# Patient Record
Sex: Female | Born: 1955 | Race: White | Hispanic: No | Marital: Married
Health system: Southern US, Community
[De-identification: ages and names within clinical notes are randomized; demographics above are authoritative.]

---

## 2019-03-28 ENCOUNTER — Other Ambulatory Visit: Payer: Self-pay | Admitting: Family Medicine

## 2019-03-28 ENCOUNTER — Other Ambulatory Visit: Payer: Self-pay

## 2019-03-28 DIAGNOSIS — Z1231 Encounter for screening mammogram for malignant neoplasm of breast: Secondary | ICD-10-CM

## 2019-04-07 ENCOUNTER — Other Ambulatory Visit: Payer: Self-pay

## 2019-04-07 ENCOUNTER — Ambulatory Visit
Admission: RE | Admit: 2019-04-07 | Discharge: 2019-04-07 | Disposition: A | Discharge status: Home / Self Care | Payer: Medicare HMO | Source: Ambulatory Visit | Attending: Family Medicine | Admitting: Family Medicine

## 2019-04-07 DIAGNOSIS — Z1231 Encounter for screening mammogram for malignant neoplasm of breast: Secondary | ICD-10-CM | POA: Diagnosis not present

## 2019-07-09 DIAGNOSIS — R69 Illness, unspecified: Secondary | ICD-10-CM | POA: Diagnosis not present

## 2019-08-25 DIAGNOSIS — M4727 Other spondylosis with radiculopathy, lumbosacral region: Secondary | ICD-10-CM | POA: Diagnosis not present

## 2019-08-25 DIAGNOSIS — Z23 Encounter for immunization: Secondary | ICD-10-CM | POA: Diagnosis not present

## 2019-08-25 DIAGNOSIS — R69 Illness, unspecified: Secondary | ICD-10-CM | POA: Diagnosis not present

## 2019-08-25 DIAGNOSIS — Z0000 Encounter for general adult medical examination without abnormal findings: Secondary | ICD-10-CM | POA: Diagnosis not present

## 2019-08-25 DIAGNOSIS — Z6821 Body mass index (BMI) 21.0-21.9, adult: Secondary | ICD-10-CM | POA: Diagnosis not present

## 2019-09-12 DIAGNOSIS — L821 Other seborrheic keratosis: Secondary | ICD-10-CM | POA: Diagnosis not present

## 2019-09-12 DIAGNOSIS — L570 Actinic keratosis: Secondary | ICD-10-CM | POA: Diagnosis not present

## 2019-09-12 DIAGNOSIS — Z23 Encounter for immunization: Secondary | ICD-10-CM | POA: Diagnosis not present

## 2019-09-12 DIAGNOSIS — D2271 Melanocytic nevi of right lower limb, including hip: Secondary | ICD-10-CM | POA: Diagnosis not present

## 2019-12-16 DIAGNOSIS — L570 Actinic keratosis: Secondary | ICD-10-CM | POA: Diagnosis not present

## 2019-12-16 DIAGNOSIS — L821 Other seborrheic keratosis: Secondary | ICD-10-CM | POA: Diagnosis not present

## 2019-12-16 DIAGNOSIS — Z872 Personal history of diseases of the skin and subcutaneous tissue: Secondary | ICD-10-CM | POA: Diagnosis not present

## 2019-12-28 ENCOUNTER — Ambulatory Visit: Payer: Medicare HMO

## 2019-12-30 DIAGNOSIS — H40053 Ocular hypertension, bilateral: Secondary | ICD-10-CM | POA: Diagnosis not present

## 2020-01-02 DIAGNOSIS — D485 Neoplasm of uncertain behavior of skin: Secondary | ICD-10-CM | POA: Diagnosis not present

## 2020-01-02 DIAGNOSIS — I781 Nevus, non-neoplastic: Secondary | ICD-10-CM | POA: Diagnosis not present

## 2020-01-02 DIAGNOSIS — H02834 Dermatochalasis of left upper eyelid: Secondary | ICD-10-CM | POA: Diagnosis not present

## 2020-01-02 DIAGNOSIS — H02831 Dermatochalasis of right upper eyelid: Secondary | ICD-10-CM | POA: Diagnosis not present

## 2020-01-03 ENCOUNTER — Ambulatory Visit: Payer: Medicare HMO | Attending: Internal Medicine

## 2020-01-03 DIAGNOSIS — Z23 Encounter for immunization: Secondary | ICD-10-CM | POA: Insufficient documentation

## 2020-01-03 NOTE — Progress Notes (Signed)
   Covid-19 Vaccination Clinic  Name:  Evelyn Cook    MRN: [id number] DOB: 1956/11/12  01/03/2020  Evelyn Cook was observed post Covid-19 immunization for 15 minutes without incidence. She was provided with Vaccine Information Sheet and instruction to access the V-Safe system.   Evelyn Cook was instructed to call 911 with any severe reactions post vaccine: Marland Kitchen Difficulty breathing  . Swelling of your face and throat  . A fast heartbeat  . A bad rash all over your body  . Dizziness and weakness    Immunizations Administered    Name Date Dose VIS Date Route   Pfizer COVID-19 Vaccine 01/03/2020 11:49 AM 0.3 mL 11/07/2019 Intramuscular   Manufacturer: [place]   Lot: [id number]   [place]: [id number]

## 2020-01-08 ENCOUNTER — Ambulatory Visit: Payer: Medicare HMO

## 2020-01-14 DIAGNOSIS — R69 Illness, unspecified: Secondary | ICD-10-CM | POA: Diagnosis not present

## 2020-01-27 ENCOUNTER — Ambulatory Visit: Payer: Medicare HMO | Attending: Internal Medicine

## 2020-01-27 DIAGNOSIS — Z23 Encounter for immunization: Secondary | ICD-10-CM | POA: Insufficient documentation

## 2020-01-27 NOTE — Progress Notes (Signed)
   Covid-19 Vaccination Clinic  Name:  Magda Bolenbaugh    MRN: [id number] DOB: 1956-08-01  01/27/2020  Ms. Wissinger was observed post Covid-19 immunization for 15 minutes without incident. She was provided with Vaccine Information Sheet and instruction to access the V-Safe system.   Ms. Claycamp was instructed to call 911 with any severe reactions post vaccine: Marland Kitchen Difficulty breathing  . Swelling of face and throat  . A fast heartbeat  . A bad rash all over body  . Dizziness and weakness   Immunizations Administered    Name Date Dose VIS Date Route   Pfizer COVID-19 Vaccine 01/27/2020 10:09 AM 0.3 mL 11/07/2019 Intramuscular   Manufacturer: [place]   Lot: [id number]   [place]: [id number]

## 2020-01-29 DIAGNOSIS — L570 Actinic keratosis: Secondary | ICD-10-CM | POA: Diagnosis not present

## 2020-01-29 DIAGNOSIS — D485 Neoplasm of uncertain behavior of skin: Secondary | ICD-10-CM | POA: Diagnosis not present

## 2020-01-29 DIAGNOSIS — L905 Scar conditions and fibrosis of skin: Secondary | ICD-10-CM | POA: Diagnosis not present

## 2020-02-12 DIAGNOSIS — H02834 Dermatochalasis of left upper eyelid: Secondary | ICD-10-CM | POA: Diagnosis not present

## 2020-02-12 DIAGNOSIS — I781 Nevus, non-neoplastic: Secondary | ICD-10-CM | POA: Diagnosis not present

## 2020-02-12 DIAGNOSIS — H02831 Dermatochalasis of right upper eyelid: Secondary | ICD-10-CM | POA: Diagnosis not present

## 2020-02-12 DIAGNOSIS — L570 Actinic keratosis: Secondary | ICD-10-CM | POA: Diagnosis not present

## 2020-03-29 DIAGNOSIS — H40013 Open angle with borderline findings, low risk, bilateral: Secondary | ICD-10-CM | POA: Diagnosis not present

## 2020-03-31 DIAGNOSIS — R140 Abdominal distension (gaseous): Secondary | ICD-10-CM | POA: Diagnosis not present

## 2020-03-31 DIAGNOSIS — Z86010 Personal history of colonic polyps: Secondary | ICD-10-CM | POA: Diagnosis not present

## 2020-03-31 DIAGNOSIS — R195 Other fecal abnormalities: Secondary | ICD-10-CM | POA: Diagnosis not present

## 2020-05-19 DIAGNOSIS — Z1159 Encounter for screening for other viral diseases: Secondary | ICD-10-CM | POA: Diagnosis not present

## 2020-05-24 DIAGNOSIS — K635 Polyp of colon: Secondary | ICD-10-CM | POA: Diagnosis not present

## 2020-05-24 DIAGNOSIS — D122 Benign neoplasm of ascending colon: Secondary | ICD-10-CM | POA: Diagnosis not present

## 2020-05-24 DIAGNOSIS — K5730 Diverticulosis of large intestine without perforation or abscess without bleeding: Secondary | ICD-10-CM | POA: Diagnosis not present

## 2020-05-24 DIAGNOSIS — K644 Residual hemorrhoidal skin tags: Secondary | ICD-10-CM | POA: Diagnosis not present

## 2020-05-24 DIAGNOSIS — K648 Other hemorrhoids: Secondary | ICD-10-CM | POA: Diagnosis not present

## 2020-05-24 DIAGNOSIS — Z86010 Personal history of colonic polyps: Secondary | ICD-10-CM | POA: Diagnosis not present

## 2020-05-26 DIAGNOSIS — D122 Benign neoplasm of ascending colon: Secondary | ICD-10-CM | POA: Diagnosis not present

## 2020-05-26 DIAGNOSIS — K635 Polyp of colon: Secondary | ICD-10-CM | POA: Diagnosis not present

## 2020-06-03 ENCOUNTER — Other Ambulatory Visit: Payer: Self-pay | Admitting: Family Medicine

## 2020-06-03 DIAGNOSIS — Z1231 Encounter for screening mammogram for malignant neoplasm of breast: Secondary | ICD-10-CM

## 2020-06-25 DIAGNOSIS — I781 Nevus, non-neoplastic: Secondary | ICD-10-CM | POA: Diagnosis not present

## 2020-06-25 DIAGNOSIS — H02831 Dermatochalasis of right upper eyelid: Secondary | ICD-10-CM | POA: Diagnosis not present

## 2020-06-25 DIAGNOSIS — H02834 Dermatochalasis of left upper eyelid: Secondary | ICD-10-CM | POA: Diagnosis not present

## 2020-06-25 DIAGNOSIS — L814 Other melanin hyperpigmentation: Secondary | ICD-10-CM | POA: Diagnosis not present

## 2020-06-25 DIAGNOSIS — Z872 Personal history of diseases of the skin and subcutaneous tissue: Secondary | ICD-10-CM | POA: Diagnosis not present

## 2020-06-30 ENCOUNTER — Ambulatory Visit: Payer: Medicare HMO

## 2020-06-30 DIAGNOSIS — J029 Acute pharyngitis, unspecified: Secondary | ICD-10-CM | POA: Diagnosis not present

## 2020-06-30 DIAGNOSIS — R05 Cough: Secondary | ICD-10-CM | POA: Diagnosis not present

## 2020-07-14 DIAGNOSIS — R69 Illness, unspecified: Secondary | ICD-10-CM | POA: Diagnosis not present

## 2020-07-26 DIAGNOSIS — R69 Illness, unspecified: Secondary | ICD-10-CM | POA: Diagnosis not present

## 2020-08-27 DIAGNOSIS — Z131 Encounter for screening for diabetes mellitus: Secondary | ICD-10-CM | POA: Diagnosis not present

## 2020-08-27 DIAGNOSIS — R69 Illness, unspecified: Secondary | ICD-10-CM | POA: Diagnosis not present

## 2020-08-27 DIAGNOSIS — Z6821 Body mass index (BMI) 21.0-21.9, adult: Secondary | ICD-10-CM | POA: Diagnosis not present

## 2020-08-27 DIAGNOSIS — Z1159 Encounter for screening for other viral diseases: Secondary | ICD-10-CM | POA: Diagnosis not present

## 2020-08-27 DIAGNOSIS — Z23 Encounter for immunization: Secondary | ICD-10-CM | POA: Diagnosis not present

## 2020-08-27 DIAGNOSIS — Z780 Asymptomatic menopausal state: Secondary | ICD-10-CM | POA: Diagnosis not present

## 2020-08-27 DIAGNOSIS — Z86010 Personal history of colonic polyps: Secondary | ICD-10-CM | POA: Diagnosis not present

## 2020-08-27 DIAGNOSIS — Z136 Encounter for screening for cardiovascular disorders: Secondary | ICD-10-CM | POA: Diagnosis not present

## 2020-08-27 DIAGNOSIS — Z0000 Encounter for general adult medical examination without abnormal findings: Secondary | ICD-10-CM | POA: Diagnosis not present

## 2020-09-03 ENCOUNTER — Other Ambulatory Visit: Payer: Self-pay | Admitting: Family Medicine

## 2020-09-03 DIAGNOSIS — E2839 Other primary ovarian failure: Secondary | ICD-10-CM

## 2020-09-03 DIAGNOSIS — Z780 Asymptomatic menopausal state: Secondary | ICD-10-CM

## 2020-09-28 DIAGNOSIS — Z86018 Personal history of other benign neoplasm: Secondary | ICD-10-CM | POA: Diagnosis not present

## 2020-09-28 DIAGNOSIS — D225 Melanocytic nevi of trunk: Secondary | ICD-10-CM | POA: Diagnosis not present

## 2020-09-28 DIAGNOSIS — L578 Other skin changes due to chronic exposure to nonionizing radiation: Secondary | ICD-10-CM | POA: Diagnosis not present

## 2020-09-28 DIAGNOSIS — L570 Actinic keratosis: Secondary | ICD-10-CM | POA: Diagnosis not present

## 2020-09-28 DIAGNOSIS — D2271 Melanocytic nevi of right lower limb, including hip: Secondary | ICD-10-CM | POA: Diagnosis not present

## 2020-09-28 DIAGNOSIS — L821 Other seborrheic keratosis: Secondary | ICD-10-CM | POA: Diagnosis not present

## 2020-10-05 DIAGNOSIS — H40013 Open angle with borderline findings, low risk, bilateral: Secondary | ICD-10-CM | POA: Diagnosis not present

## 2020-12-20 ENCOUNTER — Other Ambulatory Visit: Payer: Self-pay

## 2020-12-20 ENCOUNTER — Other Ambulatory Visit: Payer: Medicare HMO

## 2020-12-20 ENCOUNTER — Ambulatory Visit
Admission: RE | Admit: 2020-12-20 | Discharge: 2020-12-20 | Disposition: A | Discharge status: Home / Self Care | Payer: Medicare HMO | Source: Ambulatory Visit | Attending: Family Medicine | Admitting: Family Medicine

## 2020-12-20 DIAGNOSIS — Z1231 Encounter for screening mammogram for malignant neoplasm of breast: Secondary | ICD-10-CM

## 2021-03-04 ENCOUNTER — Other Ambulatory Visit: Payer: Medicare HMO

## 2021-03-09 ENCOUNTER — Other Ambulatory Visit: Payer: Medicare HMO

## 2021-03-29 ENCOUNTER — Ambulatory Visit
Admission: RE | Admit: 2021-03-29 | Discharge: 2021-03-29 | Disposition: A | Discharge status: Home / Self Care | Payer: Medicare HMO | Source: Ambulatory Visit | Attending: Family Medicine | Admitting: Family Medicine

## 2021-03-29 ENCOUNTER — Other Ambulatory Visit: Payer: Self-pay

## 2021-03-29 DIAGNOSIS — M85852 Other specified disorders of bone density and structure, left thigh: Secondary | ICD-10-CM | POA: Diagnosis not present

## 2021-03-29 DIAGNOSIS — Z780 Asymptomatic menopausal state: Secondary | ICD-10-CM

## 2021-03-29 DIAGNOSIS — E2839 Other primary ovarian failure: Secondary | ICD-10-CM

## 2021-04-18 DIAGNOSIS — M5442 Lumbago with sciatica, left side: Secondary | ICD-10-CM | POA: Diagnosis not present

## 2021-04-29 DIAGNOSIS — M6281 Muscle weakness (generalized): Secondary | ICD-10-CM | POA: Diagnosis not present

## 2021-04-29 DIAGNOSIS — M5416 Radiculopathy, lumbar region: Secondary | ICD-10-CM | POA: Diagnosis not present

## 2021-05-16 DIAGNOSIS — M6281 Muscle weakness (generalized): Secondary | ICD-10-CM | POA: Diagnosis not present

## 2021-05-16 DIAGNOSIS — M5416 Radiculopathy, lumbar region: Secondary | ICD-10-CM | POA: Diagnosis not present

## 2021-05-17 DIAGNOSIS — M6281 Muscle weakness (generalized): Secondary | ICD-10-CM | POA: Diagnosis not present

## 2021-05-17 DIAGNOSIS — M5416 Radiculopathy, lumbar region: Secondary | ICD-10-CM | POA: Diagnosis not present

## 2021-05-18 DIAGNOSIS — M5416 Radiculopathy, lumbar region: Secondary | ICD-10-CM | POA: Diagnosis not present

## 2021-05-18 DIAGNOSIS — M6281 Muscle weakness (generalized): Secondary | ICD-10-CM | POA: Diagnosis not present

## 2021-05-23 DIAGNOSIS — M5416 Radiculopathy, lumbar region: Secondary | ICD-10-CM | POA: Diagnosis not present

## 2021-05-23 DIAGNOSIS — M6281 Muscle weakness (generalized): Secondary | ICD-10-CM | POA: Diagnosis not present

## 2021-05-25 DIAGNOSIS — M6281 Muscle weakness (generalized): Secondary | ICD-10-CM | POA: Diagnosis not present

## 2021-05-25 DIAGNOSIS — M5416 Radiculopathy, lumbar region: Secondary | ICD-10-CM | POA: Diagnosis not present

## 2021-06-01 DIAGNOSIS — M6281 Muscle weakness (generalized): Secondary | ICD-10-CM | POA: Diagnosis not present

## 2021-06-01 DIAGNOSIS — M5416 Radiculopathy, lumbar region: Secondary | ICD-10-CM | POA: Diagnosis not present

## 2021-06-03 DIAGNOSIS — M6281 Muscle weakness (generalized): Secondary | ICD-10-CM | POA: Diagnosis not present

## 2021-06-03 DIAGNOSIS — M5416 Radiculopathy, lumbar region: Secondary | ICD-10-CM | POA: Diagnosis not present

## 2021-09-02 DIAGNOSIS — E7800 Pure hypercholesterolemia, unspecified: Secondary | ICD-10-CM | POA: Diagnosis not present

## 2021-09-02 DIAGNOSIS — Z23 Encounter for immunization: Secondary | ICD-10-CM | POA: Diagnosis not present

## 2021-09-02 DIAGNOSIS — F5101 Primary insomnia: Secondary | ICD-10-CM | POA: Diagnosis not present

## 2021-09-02 DIAGNOSIS — M859 Disorder of bone density and structure, unspecified: Secondary | ICD-10-CM | POA: Diagnosis not present

## 2021-09-02 DIAGNOSIS — M4727 Other spondylosis with radiculopathy, lumbosacral region: Secondary | ICD-10-CM | POA: Diagnosis not present

## 2021-09-02 DIAGNOSIS — Z79899 Other long term (current) drug therapy: Secondary | ICD-10-CM | POA: Diagnosis not present

## 2021-09-02 DIAGNOSIS — M8580 Other specified disorders of bone density and structure, unspecified site: Secondary | ICD-10-CM | POA: Diagnosis not present

## 2021-09-02 DIAGNOSIS — R69 Illness, unspecified: Secondary | ICD-10-CM | POA: Diagnosis not present

## 2021-09-02 DIAGNOSIS — Z0000 Encounter for general adult medical examination without abnormal findings: Secondary | ICD-10-CM | POA: Diagnosis not present

## 2021-09-02 DIAGNOSIS — Z86010 Personal history of colonic polyps: Secondary | ICD-10-CM | POA: Diagnosis not present

## 2021-09-29 DIAGNOSIS — L578 Other skin changes due to chronic exposure to nonionizing radiation: Secondary | ICD-10-CM | POA: Diagnosis not present

## 2021-09-29 DIAGNOSIS — Z86018 Personal history of other benign neoplasm: Secondary | ICD-10-CM | POA: Diagnosis not present

## 2021-09-29 DIAGNOSIS — D2271 Melanocytic nevi of right lower limb, including hip: Secondary | ICD-10-CM | POA: Diagnosis not present

## 2021-09-29 DIAGNOSIS — L570 Actinic keratosis: Secondary | ICD-10-CM | POA: Diagnosis not present

## 2021-09-29 DIAGNOSIS — D225 Melanocytic nevi of trunk: Secondary | ICD-10-CM | POA: Diagnosis not present

## 2021-09-29 DIAGNOSIS — L821 Other seborrheic keratosis: Secondary | ICD-10-CM | POA: Diagnosis not present

## 2021-11-04 DIAGNOSIS — H40053 Ocular hypertension, bilateral: Secondary | ICD-10-CM | POA: Diagnosis not present

## 2021-11-11 ENCOUNTER — Other Ambulatory Visit: Payer: Self-pay | Admitting: Family Medicine

## 2021-11-11 DIAGNOSIS — Z1231 Encounter for screening mammogram for malignant neoplasm of breast: Secondary | ICD-10-CM

## 2021-12-29 ENCOUNTER — Ambulatory Visit
Admission: RE | Admit: 2021-12-29 | Discharge: 2021-12-29 | Disposition: A | Discharge status: Home / Self Care | Payer: Medicare HMO | Source: Ambulatory Visit | Attending: Family Medicine | Admitting: Family Medicine

## 2021-12-29 DIAGNOSIS — Z1231 Encounter for screening mammogram for malignant neoplasm of breast: Secondary | ICD-10-CM

## 2022-02-26 IMAGING — MG MM DIGITAL SCREENING BILAT W/ TOMO AND CAD
8 series · 9 of 24 positions shown · non-contrast
Comparison: Previous exam(s).

CLINICAL DATA: Screening.

EXAM:
DIGITAL SCREENING BILATERAL MAMMOGRAM WITH TOMOSYNTHESIS AND CAD
TECHNIQUE: Bilateral screening digital craniocaudal and mediolateral oblique
mammograms were obtained. Bilateral screening digital breast
tomosynthesis was performed. The images were evaluated with
computer-aided detection.

[L CC synth-2D]
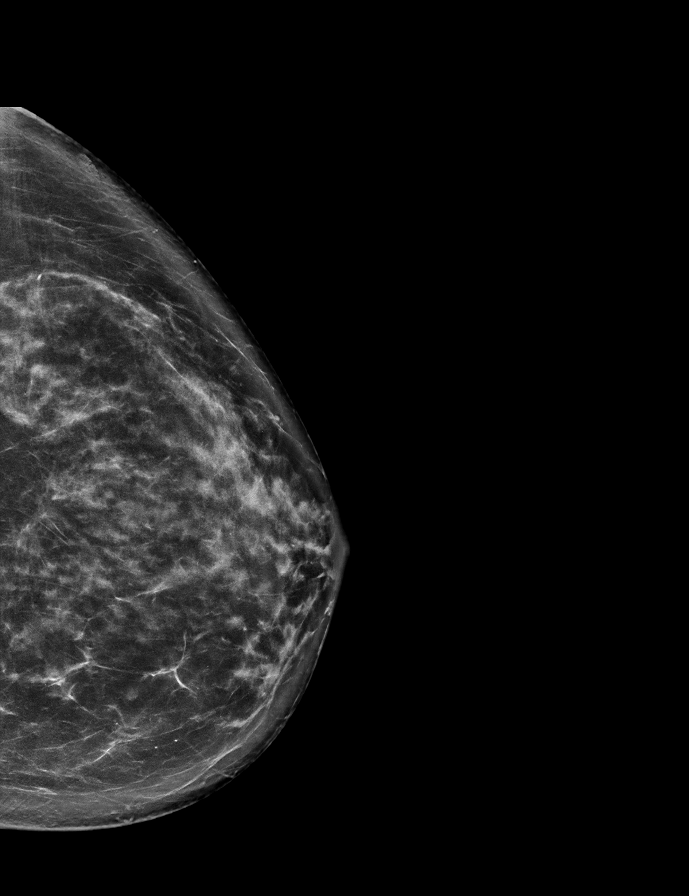

[L MLO synth-2D]
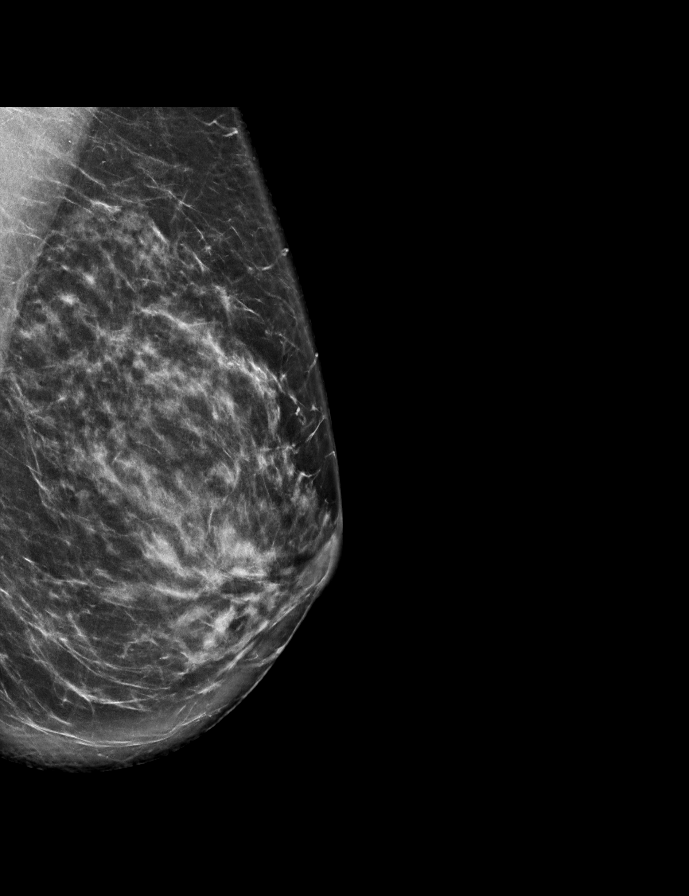

[R CC synth-2D]
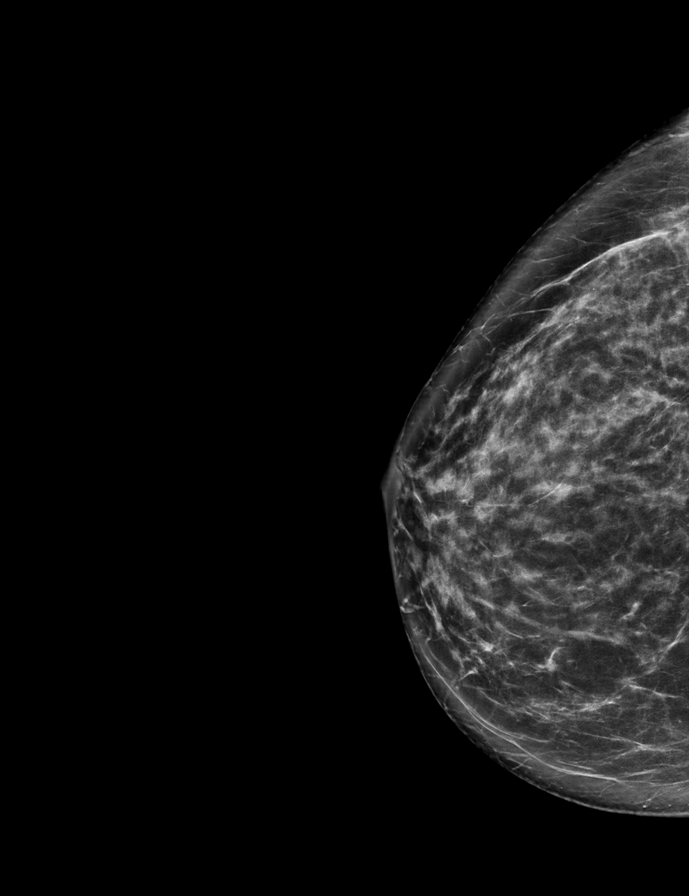

[R MLO synth-2D]
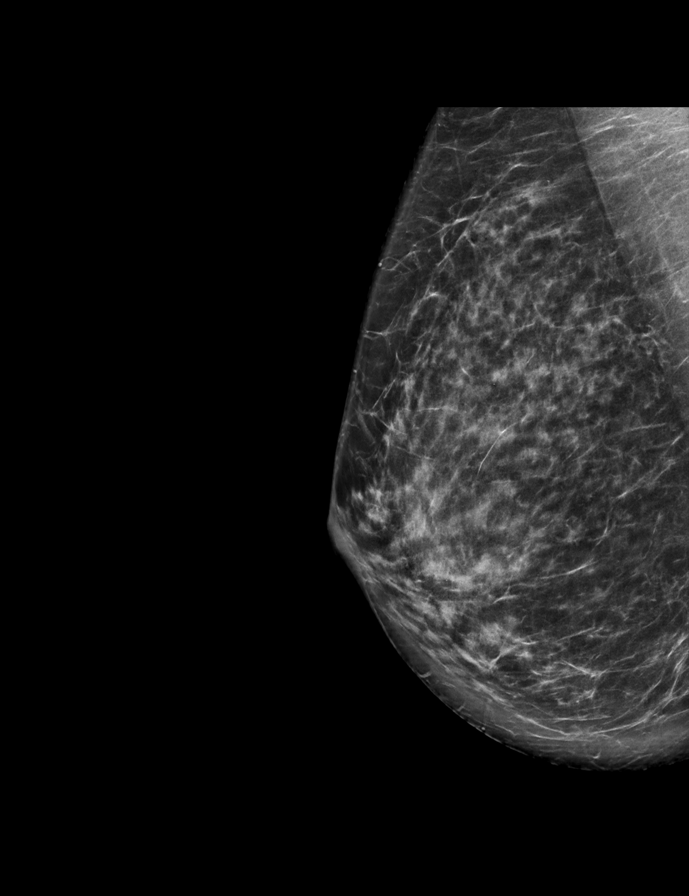

[R CC tomo · 2 of 74 frames shown]
[frame 24/74]
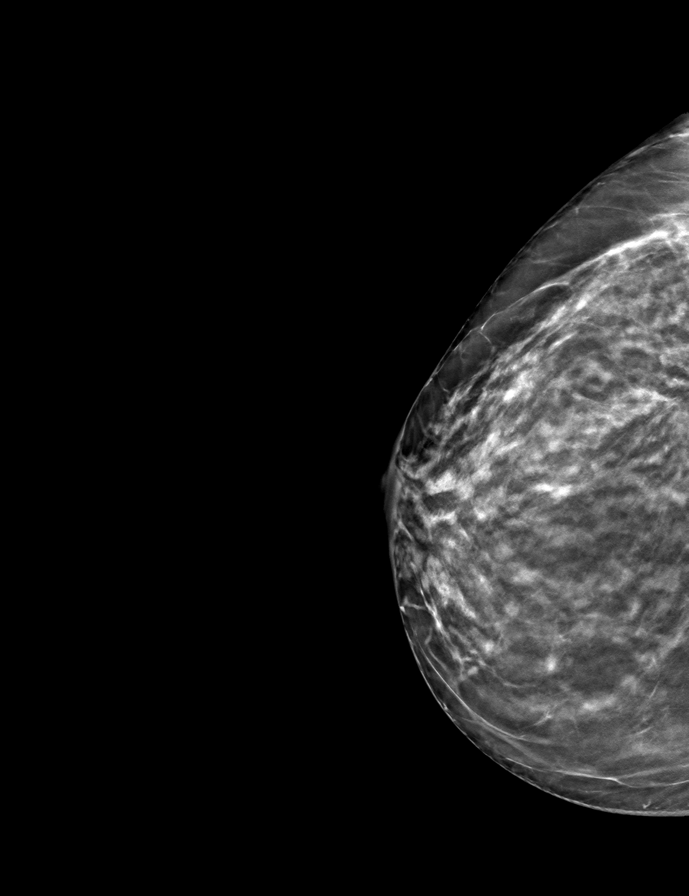
[frame 37/74]
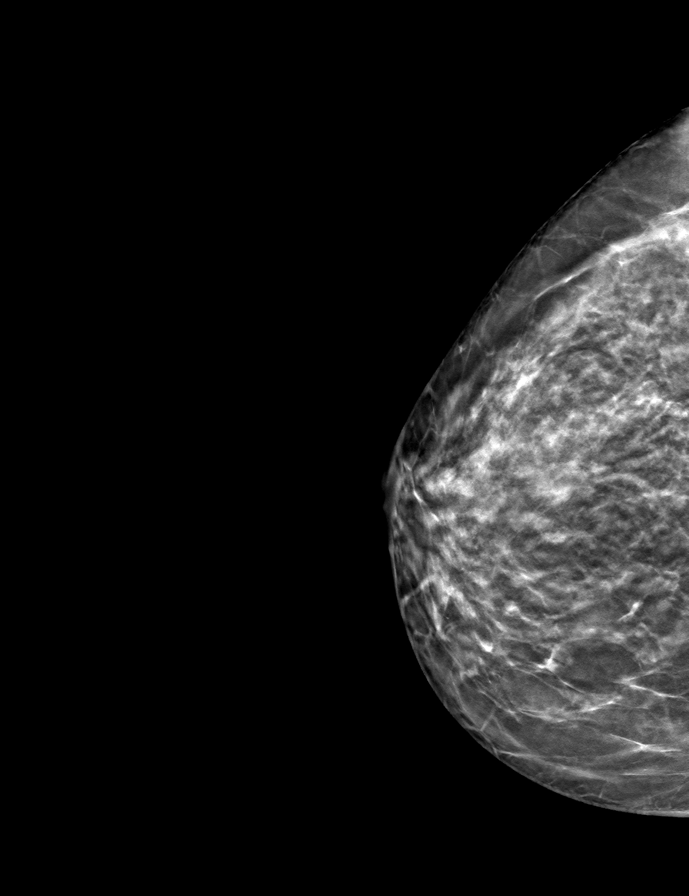

[L CC tomo · tomo slice 40/79.0]
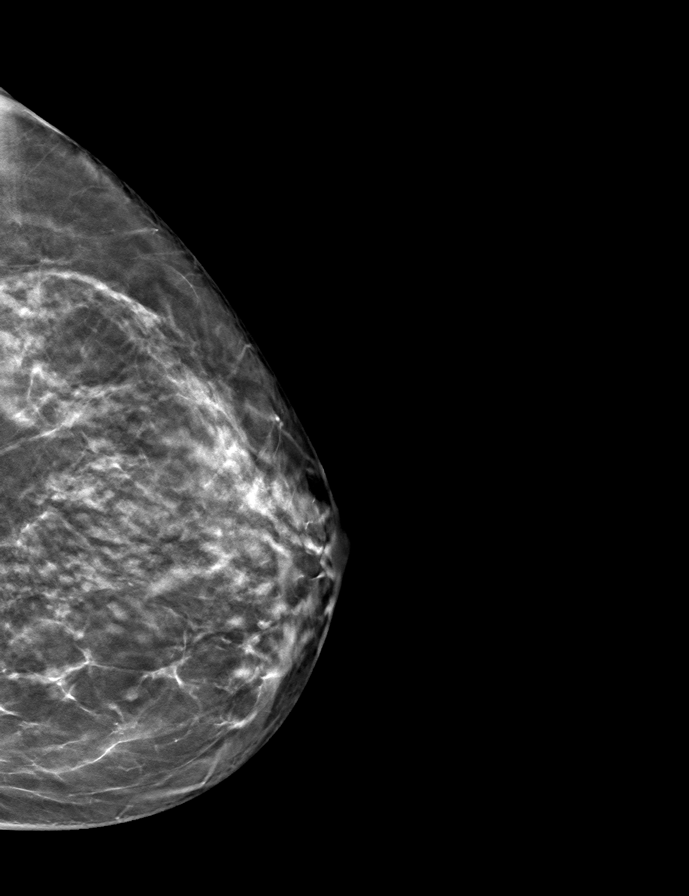

[R MLO tomo · tomo slice 39/78.0]
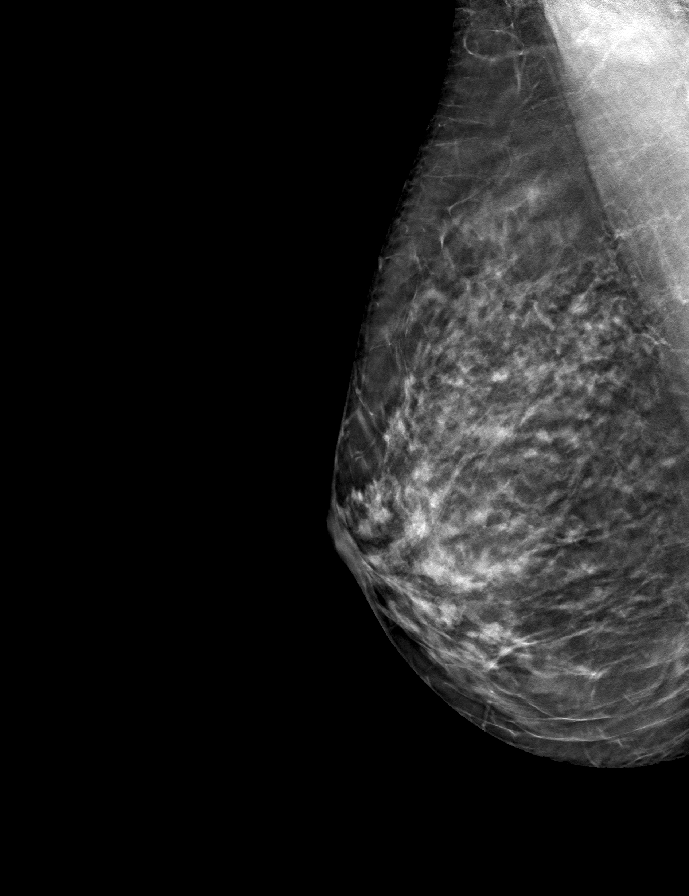

[L MLO tomo · tomo slice 41/81.0]
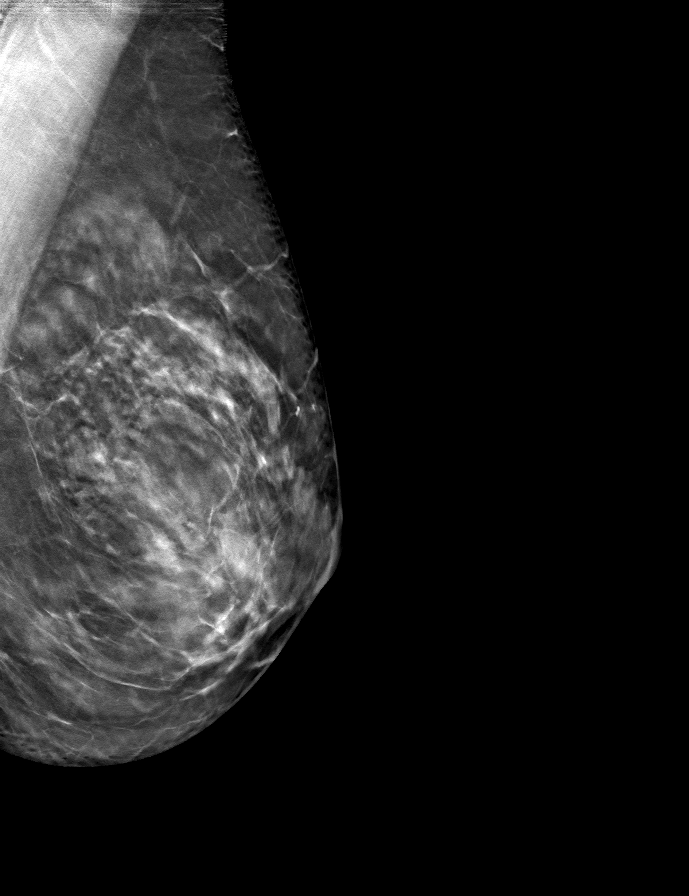

[9 of 24 positions shown; findings below may reference images not displayed]

ACR Breast Density Category c: The breast tissue is heterogeneously
dense, which may obscure small masses.
FINDINGS: There are no findings suspicious for malignancy.
IMPRESSION: No mammographic evidence of malignancy. A result letter of this
screening mammogram will be mailed directly to the patient.

RECOMMENDATION:
Screening mammogram in one year. (Code:Q3-W-BC3)

BI-RADS CATEGORY  1: Negative.

## 2022-09-07 DIAGNOSIS — D485 Neoplasm of uncertain behavior of skin: Secondary | ICD-10-CM | POA: Diagnosis not present

## 2022-09-07 DIAGNOSIS — L570 Actinic keratosis: Secondary | ICD-10-CM | POA: Diagnosis not present

## 2022-09-07 DIAGNOSIS — L821 Other seborrheic keratosis: Secondary | ICD-10-CM | POA: Diagnosis not present

## 2022-09-07 DIAGNOSIS — L859 Epidermal thickening, unspecified: Secondary | ICD-10-CM | POA: Diagnosis not present

## 2022-10-03 DIAGNOSIS — J40 Bronchitis, not specified as acute or chronic: Secondary | ICD-10-CM | POA: Diagnosis not present

## 2022-11-14 DIAGNOSIS — M25561 Pain in right knee: Secondary | ICD-10-CM | POA: Diagnosis not present

## 2022-12-05 ENCOUNTER — Other Ambulatory Visit: Payer: Self-pay | Admitting: Family Medicine

## 2022-12-05 DIAGNOSIS — Z1231 Encounter for screening mammogram for malignant neoplasm of breast: Secondary | ICD-10-CM

## 2023-01-05 DIAGNOSIS — Z6821 Body mass index (BMI) 21.0-21.9, adult: Secondary | ICD-10-CM | POA: Diagnosis not present

## 2023-01-05 DIAGNOSIS — Z1389 Encounter for screening for other disorder: Secondary | ICD-10-CM | POA: Diagnosis not present

## 2023-01-05 DIAGNOSIS — R69 Illness, unspecified: Secondary | ICD-10-CM | POA: Diagnosis not present

## 2023-01-05 DIAGNOSIS — Z86010 Personal history of colonic polyps: Secondary | ICD-10-CM | POA: Diagnosis not present

## 2023-01-05 DIAGNOSIS — F5101 Primary insomnia: Secondary | ICD-10-CM | POA: Diagnosis not present

## 2023-01-05 DIAGNOSIS — Z0000 Encounter for general adult medical examination without abnormal findings: Secondary | ICD-10-CM | POA: Diagnosis not present

## 2023-01-05 DIAGNOSIS — Z79899 Other long term (current) drug therapy: Secondary | ICD-10-CM | POA: Diagnosis not present

## 2023-01-05 DIAGNOSIS — M8580 Other specified disorders of bone density and structure, unspecified site: Secondary | ICD-10-CM | POA: Diagnosis not present

## 2023-01-05 DIAGNOSIS — E7800 Pure hypercholesterolemia, unspecified: Secondary | ICD-10-CM | POA: Diagnosis not present

## 2023-01-10 ENCOUNTER — Other Ambulatory Visit (HOSPITAL_COMMUNITY): Payer: Self-pay | Admitting: Family Medicine

## 2023-01-10 DIAGNOSIS — E7800 Pure hypercholesterolemia, unspecified: Secondary | ICD-10-CM

## 2023-01-26 ENCOUNTER — Ambulatory Visit
Admission: RE | Admit: 2023-01-26 | Discharge: 2023-01-26 | Disposition: A | Discharge status: Home / Self Care | Payer: Medicare HMO | Source: Ambulatory Visit | Attending: Family Medicine | Admitting: Family Medicine

## 2023-01-26 DIAGNOSIS — Z1231 Encounter for screening mammogram for malignant neoplasm of breast: Secondary | ICD-10-CM

## 2023-02-12 ENCOUNTER — Ambulatory Visit (HOSPITAL_BASED_OUTPATIENT_CLINIC_OR_DEPARTMENT_OTHER)
Admission: RE | Admit: 2023-02-12 | Discharge: 2023-02-12 | Disposition: A | Discharge status: Home / Self Care | Payer: Medicare HMO | Source: Ambulatory Visit | Attending: Family Medicine | Admitting: Family Medicine

## 2023-02-12 DIAGNOSIS — E7800 Pure hypercholesterolemia, unspecified: Secondary | ICD-10-CM | POA: Insufficient documentation

## 2023-02-14 DIAGNOSIS — Z0100 Encounter for examination of eyes and vision without abnormal findings: Secondary | ICD-10-CM | POA: Diagnosis not present

## 2023-02-14 DIAGNOSIS — H40013 Open angle with borderline findings, low risk, bilateral: Secondary | ICD-10-CM | POA: Diagnosis not present

## 2023-03-15 DIAGNOSIS — D2271 Melanocytic nevi of right lower limb, including hip: Secondary | ICD-10-CM | POA: Diagnosis not present

## 2023-03-15 DIAGNOSIS — L570 Actinic keratosis: Secondary | ICD-10-CM | POA: Diagnosis not present

## 2023-03-15 DIAGNOSIS — L578 Other skin changes due to chronic exposure to nonionizing radiation: Secondary | ICD-10-CM | POA: Diagnosis not present

## 2023-03-15 DIAGNOSIS — D225 Melanocytic nevi of trunk: Secondary | ICD-10-CM | POA: Diagnosis not present

## 2023-03-15 DIAGNOSIS — Z86018 Personal history of other benign neoplasm: Secondary | ICD-10-CM | POA: Diagnosis not present

## 2023-03-15 DIAGNOSIS — L821 Other seborrheic keratosis: Secondary | ICD-10-CM | POA: Diagnosis not present

## 2023-06-18 DIAGNOSIS — I700 Atherosclerosis of aorta: Secondary | ICD-10-CM | POA: Diagnosis not present

## 2023-10-30 DIAGNOSIS — D485 Neoplasm of uncertain behavior of skin: Secondary | ICD-10-CM | POA: Diagnosis not present

## 2023-10-30 DIAGNOSIS — L570 Actinic keratosis: Secondary | ICD-10-CM | POA: Diagnosis not present

## 2023-10-30 DIAGNOSIS — L821 Other seborrheic keratosis: Secondary | ICD-10-CM | POA: Diagnosis not present

## 2023-12-11 ENCOUNTER — Ambulatory Visit: Payer: Medicare HMO | Admitting: Podiatry

## 2023-12-13 ENCOUNTER — Other Ambulatory Visit: Payer: Self-pay | Admitting: Family Medicine

## 2023-12-13 DIAGNOSIS — Z1231 Encounter for screening mammogram for malignant neoplasm of breast: Secondary | ICD-10-CM

## 2023-12-17 ENCOUNTER — Ambulatory Visit (INDEPENDENT_AMBULATORY_CARE_PROVIDER_SITE_OTHER): Payer: Medicare HMO | Admitting: Podiatry

## 2023-12-17 ENCOUNTER — Ambulatory Visit (INDEPENDENT_AMBULATORY_CARE_PROVIDER_SITE_OTHER): Payer: Medicare HMO

## 2023-12-17 ENCOUNTER — Encounter: Payer: Self-pay | Admitting: Podiatry

## 2023-12-17 VITALS — Ht 64.0 in | Wt 125.0 lb

## 2023-12-17 DIAGNOSIS — M778 Other enthesopathies, not elsewhere classified: Secondary | ICD-10-CM | POA: Diagnosis not present

## 2023-12-17 DIAGNOSIS — M21622 Bunionette of left foot: Secondary | ICD-10-CM | POA: Diagnosis not present

## 2023-12-17 DIAGNOSIS — M21611 Bunion of right foot: Secondary | ICD-10-CM | POA: Diagnosis not present

## 2023-12-17 NOTE — Progress Notes (Unsigned)
Chief Complaint  Patient presents with   Foot Pain    " I have bunions on her feet and she does not have pain and the right is worse than the left and when  I am out more than 5 hours I get pain and rubbing, if there is something  I can do to help"   HPI: 68 y.o. female presents today with concern of a bunion on the right foot and a tailor's bunion on the left foot.  She states that she does not have any significant symptoms.  She just wants to know if there is any conservative measures that she can perform now to possibly either avoid surgery or help correct the deformities.  She states that these sometimes rub in her shoes and can cause some discomfort if she is wearing them for several hours.  Denies injury to these areas.  No past medical history on file.  No past surgical history on file.  Allergies  Allergen Reactions   Percocet [Oxycodone-Acetaminophen] Other (See Comments)    "Hallucinations"   Percodan-Demi [Oxycodone-Aspirin] Other (See Comments)    "I woke up and my heart was pounding and hallucinations"    Physical Exam: There are palpable pedal pulses bilateral.  There is a bony palpable prominence on the medial aspect the first metatarsal head on the right foot with some lateral angulation of the hallux.  This is reducible manually.  There is no pain with range of motion of the first MPJ.  There is slight decreased range of motion at the first MPJ.  There is a bony palpable prominence on the lateral aspect of the left fifth metatarsal head with medial deviation of the fifth toe at the level of the MPJ.  No associated corn is present.  No calor or evidence of irritation from shoe gear is noted at this time.  No pain on palpation of these prominent areas.  Radiographic Exam (bilateral foot, 3 weightbearing views, 12/17/2023):  Right foot: There is increased first intermetatarsal angle with joint space narrowing at the first MPJ.  Tibial sesamoid position is 4.  Increased  hallux abductus angle.  There is elevatus noted of the first metatarsal on lateral view.  Small calcaneal spur noted.  Left foot: There is slight enlargement of the lateral eminence of the fifth metatarsal head with a small bony spur present.  There is adductovarus rotation of the fifth toe.  No fracture is seen.  Assessment/Plan of Care: 1. Bunion of right foot   2. Capsulitis of foot   3. Tailor's bunionette, left    Discussed clinical and radiographic findings with patient today.  Patient was given a bunion splint to wear on the right foot when sleeping and spacers to wear during the daytime when in shoe gear.  This will help decrease the stretch on the capsule and keep the tendons from contracted further, which can aggravate the deformities.  Discussed shoe gear and accommodative types of shoes.  If conservative measures are unsuccessful then recommend surgical intervention.  Follow-up as needed   Clerance Lav, DPM, FACFAS [facility]     [address], [place] [address]  Office [phone]  Fax [phone]

## 2024-01-28 ENCOUNTER — Ambulatory Visit
Admission: RE | Admit: 2024-01-28 | Discharge: 2024-01-28 | Disposition: A | Discharge status: Home / Self Care | Payer: Medicare HMO | Source: Ambulatory Visit | Attending: Family Medicine | Admitting: Family Medicine

## 2024-01-28 DIAGNOSIS — Z1231 Encounter for screening mammogram for malignant neoplasm of breast: Secondary | ICD-10-CM | POA: Diagnosis not present

## 2024-02-15 DIAGNOSIS — H40013 Open angle with borderline findings, low risk, bilateral: Secondary | ICD-10-CM | POA: Diagnosis not present

## 2024-03-31 DIAGNOSIS — I700 Atherosclerosis of aorta: Secondary | ICD-10-CM | POA: Diagnosis not present

## 2024-04-03 DIAGNOSIS — D225 Melanocytic nevi of trunk: Secondary | ICD-10-CM | POA: Diagnosis not present

## 2024-04-03 DIAGNOSIS — L578 Other skin changes due to chronic exposure to nonionizing radiation: Secondary | ICD-10-CM | POA: Diagnosis not present

## 2024-04-03 DIAGNOSIS — D2271 Melanocytic nevi of right lower limb, including hip: Secondary | ICD-10-CM | POA: Diagnosis not present

## 2024-04-03 DIAGNOSIS — Z86018 Personal history of other benign neoplasm: Secondary | ICD-10-CM | POA: Diagnosis not present

## 2024-04-03 DIAGNOSIS — L821 Other seborrheic keratosis: Secondary | ICD-10-CM | POA: Diagnosis not present

## 2024-04-03 DIAGNOSIS — L570 Actinic keratosis: Secondary | ICD-10-CM | POA: Diagnosis not present

## 2024-09-21 DIAGNOSIS — R5383 Other fatigue: Secondary | ICD-10-CM | POA: Diagnosis not present

## 2024-09-21 DIAGNOSIS — R0989 Other specified symptoms and signs involving the circulatory and respiratory systems: Secondary | ICD-10-CM | POA: Diagnosis not present

## 2024-10-08 DIAGNOSIS — L570 Actinic keratosis: Secondary | ICD-10-CM | POA: Diagnosis not present

## 2024-10-08 DIAGNOSIS — D0439 Carcinoma in situ of skin of other parts of face: Secondary | ICD-10-CM | POA: Diagnosis not present

## 2024-10-08 DIAGNOSIS — D485 Neoplasm of uncertain behavior of skin: Secondary | ICD-10-CM | POA: Diagnosis not present

## 2024-10-08 DIAGNOSIS — L578 Other skin changes due to chronic exposure to nonionizing radiation: Secondary | ICD-10-CM | POA: Diagnosis not present

## 2024-10-08 DIAGNOSIS — L821 Other seborrheic keratosis: Secondary | ICD-10-CM | POA: Diagnosis not present

## 2024-10-29 DIAGNOSIS — H6993 Unspecified Eustachian tube disorder, bilateral: Secondary | ICD-10-CM | POA: Diagnosis not present

## 2024-10-29 DIAGNOSIS — Z6821 Body mass index (BMI) 21.0-21.9, adult: Secondary | ICD-10-CM | POA: Diagnosis not present

## 2024-10-29 DIAGNOSIS — R8290 Unspecified abnormal findings in urine: Secondary | ICD-10-CM | POA: Diagnosis not present

## 2024-11-17 DIAGNOSIS — C44329 Squamous cell carcinoma of skin of other parts of face: Secondary | ICD-10-CM | POA: Diagnosis not present

## 2024-12-30 ENCOUNTER — Other Ambulatory Visit: Payer: Self-pay | Admitting: Family Medicine

## 2024-12-30 DIAGNOSIS — Z1231 Encounter for screening mammogram for malignant neoplasm of breast: Secondary | ICD-10-CM

## 2025-01-28 ENCOUNTER — Ambulatory Visit
